# Patient Record
Sex: Male | Born: 1995 | Race: Black or African American | Hispanic: No | Marital: Single | Smoking: Never smoker
Health system: Southern US, Community
[De-identification: ages and names within clinical notes are randomized; demographics above are authoritative.]

## PROBLEM LIST (undated history)

## (undated) DIAGNOSIS — K219 Gastro-esophageal reflux disease without esophagitis: Secondary | ICD-10-CM

## (undated) DIAGNOSIS — T7840XA Allergy, unspecified, initial encounter: Secondary | ICD-10-CM

## (undated) DIAGNOSIS — R55 Syncope and collapse: Secondary | ICD-10-CM

## (undated) HISTORY — DX: Allergy, unspecified, initial encounter: T78.40XA

## (undated) HISTORY — DX: Syncope and collapse: R55

## (undated) HISTORY — DX: Gastro-esophageal reflux disease without esophagitis: K21.9

---
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
## 2017-02-19 ENCOUNTER — Emergency Department (HOSPITAL_COMMUNITY)
Admission: EM | Admit: 2017-02-19 | Discharge: 2017-02-19 | Disposition: A | Discharge status: Home / Self Care | Payer: 59 | Attending: Emergency Medicine | Admitting: Emergency Medicine

## 2017-02-19 ENCOUNTER — Encounter (HOSPITAL_COMMUNITY): Payer: Self-pay

## 2017-02-19 DIAGNOSIS — B9789 Other viral agents as the cause of diseases classified elsewhere: Secondary | ICD-10-CM

## 2017-02-19 DIAGNOSIS — T7849XA Other allergy, initial encounter: Secondary | ICD-10-CM | POA: Diagnosis not present

## 2017-02-19 DIAGNOSIS — Z79899 Other long term (current) drug therapy: Secondary | ICD-10-CM | POA: Insufficient documentation

## 2017-02-19 DIAGNOSIS — J329 Chronic sinusitis, unspecified: Secondary | ICD-10-CM | POA: Insufficient documentation

## 2017-02-19 DIAGNOSIS — R0981 Nasal congestion: Secondary | ICD-10-CM | POA: Diagnosis present

## 2017-02-19 DIAGNOSIS — Z9109 Other allergy status, other than to drugs and biological substances: Secondary | ICD-10-CM

## 2017-02-19 LAB — RAPID STREP SCREEN (MED CTR MEBANE ONLY): Streptococcus, Group A Screen (Direct): NEGATIVE

## 2017-02-19 MED ORDER — OMEPRAZOLE 20 MG PO CPDR: 20.0000 mg | DELAYED_RELEASE_CAPSULE | Freq: Every day | ORAL | 0 refills | Status: DC

## 2017-02-19 MED ORDER — FEXOFENADINE HCL 60 MG PO TABS: 60.0000 mg | ORAL_TABLET | Freq: Every day | ORAL | 0 refills | Status: DC

## 2017-02-19 MED ORDER — PREDNISONE 50 MG PO TABS
50.0000 mg | ORAL_TABLET | Freq: Once | ORAL | Status: AC
  Administered 2017-02-19: 50 mg via ORAL
  Filled 2017-02-19: qty 1

## 2017-02-19 MED ORDER — FLUTICASONE PROPIONATE 50 MCG/ACT NA SUSP: 2.0000 | Freq: Every day | NASAL | 12 refills | Status: DC

## 2017-02-19 NOTE — ED Notes (Signed)
Pt c/o 2/10 sinus pressure, intermittent episodes of slurred speech, and a couple episodes of aphasia.

## 2017-02-19 NOTE — ED Provider Notes (Signed)
[facility]-EMERGENCY DEPT Provider Note   CSN: [id number] Arrival date & time: 02/19/17  5956     History   Chief Complaint Chief Complaint  Patient presents with  . Facial Pain    HPI Randall Delgado is a 21 y.o. male who presents to the emergency department today for 3 days of sinus congestion. The patient states that 3 days ago he started experiencing gradually increasing sinus pressure with associated rhinorrhea, muscle aches, rhinorrhea, sneezing, fatigue and sore throat, myalgias. Reports taking ibuprofen and dayquil for symptoms with moderate relief. No fever, chills, unexpected weight loss, dental pain, difficulty swallowing, ear pain, dyspnea on exertion, abdominal pain, HA, vomiting or diarrhea.   The patient notes that yesterday while talking to his wife he had an epiosde of slurred speech that resolved immediatly. He states when picked up this morning by EMS, he was asked his birthday but: "I was able to think of an answer, but I was unable to speak". The patient symptoms resolved. He had no associated neurological symptoms. Denies HA. The patient reports an episode of chest tightness lasting 5 minutes around 6am this morning. Denies illicit drug use. Patient denies risk factors for DVT/PE including recent surgery, trauma, immobility, previous blood clot, smoking, HRT, or history of cancer or thrombophilia.    HPI  History reviewed. No pertinent past medical history.  Patient Active Problem List   Diagnosis Date Noted  . 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 No pertinent surgical history.     Home Medications    Prior to Admission medications   Medication Sig Start Date End Date Taking? Authorizing Provider  fexofenadine (ALLEGRA) 60 MG tablet Take 1 tablet (60 mg total) by mouth daily. 02/19/17   Keanen Dohse, Elmer Sow, PA-C  fluticasone (FLONASE) 50 MCG/ACT nasal spray Place 2 sprays into both nostrils daily. 02/19/17   Anshul Meddings, Elmer Sow, PA-C  omeprazole  (PRILOSEC) 20 MG capsule Take 1 capsule (20 mg total) by mouth daily. 02/19/17   Jayton Popelka, Elmer Sow, PA-C    Family History History reviewed. No pertinent family history.  Social History Social History  Substance Use Topics  . Smoking status: Never Smoker  . Smokeless tobacco: Never Used  . Alcohol use No     Allergies   Patient has no known allergies.   Review of Systems Review of Systems  All other systems reviewed and are negative.    Physical Exam Updated Vital Signs BP 136/78 (BP Location: Right Arm)   Pulse 67   Temp 97.7 F (36.5 C) (Oral)   Resp 18   Ht 6' (1.829 m)   Wt 54 kg (119 lb)   SpO2 100%   BMI 16.14 kg/m   Physical Exam  Constitutional: He appears well-developed and well-nourished. No distress.  HENT:  Head: Normocephalic and atraumatic.  Right Ear: Hearing, tympanic membrane, external ear and ear canal normal. No foreign bodies. Tympanic membrane is not injected, not perforated, not erythematous, not retracted and not bulging.  Left Ear: Hearing, tympanic membrane, external ear and ear canal normal. No foreign bodies. Tympanic membrane is not injected, not perforated, not erythematous, not retracted and not bulging.  Nose: Right sinus exhibits maxillary sinus tenderness and frontal sinus tenderness. Left sinus exhibits maxillary sinus tenderness and frontal sinus tenderness.  Mouth/Throat: Uvula is midline and mucous membranes are normal. Posterior oropharyngeal erythema present. No oropharyngeal exudate or posterior oropharyngeal edema. No tonsillar exudate.  Eyes: Conjunctivae, EOM and lids are normal. Pupils are equal, round, and  reactive to light. Right eye exhibits no discharge. Left eye exhibits no discharge. Right conjunctiva is not injected. Left conjunctiva is not injected.  Neck: Normal range of motion. Neck supple.  Cardiovascular: Normal rate, regular rhythm and intact distal pulses.   No murmur heard. Pulmonary/Chest: Effort normal and  breath sounds normal. He has no wheezes. He exhibits no tenderness.  Abdominal: Soft. Bowel sounds are normal. There is no tenderness. There is no rebound and no guarding.  Musculoskeletal: He exhibits no edema.  Lymphadenopathy:    He has no cervical adenopathy.  Neurological: He is alert.  Speech clear. Follows commands. PERRLA. Chart for movements intact. Normal peripheral fields. Cranial nerves III through XII intact. Moves all extremities 4. Normal strength to upper and lower extremities bilaterally. Grip strength equal bilaterally. Sensation to light touch intact bilaterally. Patellar deep tendon reflex 2+ and equal bilaterally. Coordination intact. No limb ataxia. Finger to nose normal. Rapid alternating movements normal. No pronator drift. Normal gait.   Skin: Skin is warm and dry. No rash noted. He is not diaphoretic.  Psychiatric: He has a normal mood and affect.  Nursing note and vitals reviewed.    ED Treatments / Results  Labs (all labs ordered are listed, but only abnormal results are displayed) Labs Reviewed  RAPID STREP SCREEN (NOT AT [facility])  CULTURE, GROUP A STREP [facility])    EKG  EKG Interpretation  Date/Time:  Monday February 19 2017 08:05:52 EDT Ventricular Rate:  59 PR Interval:    QRS Duration: 104 QT Interval:  430 QTC Calculation: 426 R Axis:   43 Text Interpretation:  Sinus rhythm ST elev, probable normal early repol pattern No previous tracing Confirmed by Denton Lank  MD, Caryn Bee ([id number]) on 02/19/2017 8:24:05 AM       Radiology No results found.  Procedures Procedures (including critical care time)  Medications Ordered in ED Medications  predniSONE (DELTASONE) tablet 50 mg (50 mg Oral Given 02/19/17 0809)     Initial Impression / Assessment and Plan / ED Course  I have reviewed the triage vital signs and the nursing notes.  Pertinent labs & imaging results that were available during my care of the patient were reviewed by me and considered in my medical  decision making (see chart for details).     This is a 21 year old male who presents today with URI like symptoms. Patient non-toxic appearing with normal vital signs and benign HEENT other then sinus tenderness, erythema of posterior pharynx without exudate. Normal cardiopulmonary exam. Patient also complaining of speech like disturbances. Patient neuro exam completely normal. Will observe to see if patient symptoms return. If patient stable will discharge home with strict return precautions and close follow up with PCP.   Strep test negative. Sent for culture.   Discussed with patient option to do further testing for neurologic and chest tightness symptoms. Patient does not wish to having imaging, blood work or further testing at this time. Understands risks. He agrees to close follow up with primary care provider and return precautions.   EKG sinus rhythm with shows early repol pattern. Low suspicion for ACS with history.   PERC r/o PE.   At time of discharge the patient is resting comfortably, non-toxic appearing, normal vital signs. Will give patient fluticasone nasal spray, allergy medication for symptoms. Discussed with patient the need to continue using fluticasone nasal spray for at least 2 weeks to notice full effects. Patient agrees. Gave information for Primary Care Follow up. Return precautions given. Patient told  they can return at anytime for worsening or new concerning symptoms. All questions answered.   Final Clinical Impressions(s) / ED Diagnoses   Final diagnoses:  Viral sinusitis  Environmental allergies    New Prescriptions New Prescriptions   FEXOFENADINE (ALLEGRA) 60 MG TABLET    Take 1 tablet (60 mg total) by mouth daily.   FLUTICASONE (FLONASE) 50 MCG/ACT NASAL SPRAY    Place 2 sprays into both nostrils daily.   OMEPRAZOLE (PRILOSEC) 20 MG CAPSULE    Take 1 capsule (20 mg total) by mouth daily.     Jacinto HalimMaczis, Obert Espindola M, PA-C 02/19/17 [id number]    Jacinto HalimMaczis, Kin Galbraith M,  PA-C 02/19/17 0935    Jacinto HalimMaczis, Taha Dimond M, PA-C 02/19/17 [id number]    Cathren LaineSteinl, Kevin, MD 02/20/17 [phone]

## 2017-02-19 NOTE — ED Triage Notes (Addendum)
Sinus pressure and pain for about 3 days states he is feeling out of it alert and oriented x 3 steady gait noted no respiratory or acute distress noted. Clear speech noted moves all extremities.

## 2017-02-19 NOTE — Discharge Instructions (Signed)
Your symptoms are concurrent with a viral illness. I am giving you a nasal spray that I want you to take everyday. You will need to use this >1 week in order to see the effects. Please also take the allergy medication I am prescribing. You can take 600mg  Ibuprofen ever 6 hours for pain as needed. If you are taking ibuprofen daily please also take the omeprazole that I am prescribing you to protect your stomach. Please follow up with primary care in the next week. If start developing worsening symptoms or new concerning symptoms you can return for re-evaluation.

## 2017-02-21 LAB — CULTURE, GROUP A STREP (THRC)

## 2017-02-24 ENCOUNTER — Encounter (HOSPITAL_COMMUNITY): Payer: Self-pay

## 2017-02-24 ENCOUNTER — Emergency Department (HOSPITAL_COMMUNITY)
Admission: EM | Admit: 2017-02-24 | Discharge: 2017-02-24 | Disposition: A | Discharge status: Home / Self Care | Payer: 59 | Attending: Emergency Medicine | Admitting: Emergency Medicine

## 2017-02-24 DIAGNOSIS — L259 Unspecified contact dermatitis, unspecified cause: Secondary | ICD-10-CM | POA: Diagnosis not present

## 2017-02-24 DIAGNOSIS — J029 Acute pharyngitis, unspecified: Secondary | ICD-10-CM | POA: Diagnosis present

## 2017-02-24 MED ORDER — TRIAMCINOLONE ACETONIDE 0.1 % EX CREA
1.0000 | TOPICAL_CREAM | Freq: Two times a day (BID) | CUTANEOUS | 0 refills | Status: DC
Start: 2017-02-24 — End: 2024-02-14

## 2017-02-24 MED ORDER — PREDNISONE 20 MG PO TABS
60.0000 mg | ORAL_TABLET | Freq: Once | ORAL | Status: AC
  Administered 2017-02-24: 60 mg via ORAL
  Filled 2017-02-24: qty 3

## 2017-02-24 MED ORDER — DIPHENHYDRAMINE HCL 25 MG PO CAPS
50.0000 mg | ORAL_CAPSULE | Freq: Once | ORAL | Status: AC
  Administered 2017-02-24: 50 mg via ORAL
  Filled 2017-02-24: qty 2

## 2017-02-24 MED ORDER — PREDNISONE 10 MG PO TABS: 40.0000 mg | ORAL_TABLET | Freq: Every day | ORAL | 0 refills | Status: AC

## 2017-02-24 NOTE — ED Provider Notes (Signed)
[facility]-EMERGENCY DEPT Provider Note   CSN: [id number] Arrival date & time: 02/24/17  1246     History   Chief Complaint Chief Complaint  Patient presents with  . Sore Throat    HPI Randall Delgado is a 21 y.o. male with no past medical history presenting with worsening rash on the back of his neck for the last day which he reports is burning. He also felt like his throat was closing in when he tries to swallow and it feels scratchy. He reports being seen on Monday at [facility] for complaints of chest tightness slurred speech jerking movements dizziness headache and was prescribed Prilosec, Flonase and Allegra. He states that he has been taking Allegra and Prilosec which she thought was making him feel strange. He did not try Flonase. Overall his symptoms have improved since then and his appetite has returned except for the swallowing and rash on his neck.   HPI  History reviewed. No pertinent past medical history.  Patient Active Problem List   Diagnosis Date Noted  . 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 No pertinent surgical history.     Home Medications    Prior to Admission medications   Medication Sig Start Date End Date Taking? Authorizing Provider  fexofenadine (ALLEGRA) 60 MG tablet Take 1 tablet (60 mg total) by mouth daily. 02/19/17   Maczis, Elmer SowMichael M, PA-C  fluticasone (FLONASE) 50 MCG/ACT nasal spray Place 2 sprays into both nostrils daily. 02/19/17   Maczis, Elmer SowMichael M, PA-C  omeprazole (PRILOSEC) 20 MG capsule Take 1 capsule (20 mg total) by mouth daily. 02/19/17   Maczis, Elmer SowMichael M, PA-C  predniSONE (DELTASONE) 10 MG tablet Take 4 tablets (40 mg total) by mouth daily. 02/24/17 02/28/17  Mathews RobinsonsMitchell, Zaylee Cornia B, PA-C  triamcinolone cream (KENALOG) 0.1 % Apply 1 application topically 2 (two) times daily. 02/24/17   Georgiana ShoreMitchell, Blayre Papania B, PA-C    Family History No family history on file.  Social History Social History  Substance Use Topics  .  Smoking status: Never Smoker  . Smokeless tobacco: Never Used  . Alcohol use No     Allergies   Patient has no known allergies.   Review of Systems Review of Systems  Constitutional: Negative for activity change, appetite change, chills and fever.  HENT: Positive for sore throat and trouble swallowing. Negative for facial swelling, sinus pain, sinus pressure and voice change.   Eyes: Negative for pain and visual disturbance.  Respiratory: Negative for cough, shortness of breath, wheezing and stridor.   Cardiovascular: Negative for chest pain, palpitations and leg swelling.  Gastrointestinal: Negative for abdominal pain, nausea and vomiting.  Genitourinary: Negative for dysuria and hematuria.  Musculoskeletal: Negative for arthralgias, back pain, myalgias, neck pain and neck stiffness.  Skin: Negative for color change, pallor and rash.  Neurological: Negative for seizures, syncope and weakness.     Physical Exam Updated Vital Signs BP 114/64 (BP Location: Right Arm)   Pulse (!) 58   Temp 98.4 F (36.9 C) (Oral)   Resp 16   SpO2 100%   Physical Exam  Constitutional: He appears well-developed and well-nourished. No distress.  Patient is afebrile well-appearing sitting comfortably in chair in no acute distress.  HENT:  Head: Normocephalic and atraumatic.  Mouth/Throat: Oropharynx is clear and moist. No oropharyngeal exudate.  No gross oral abscess. Uvula is midline, arches are simmetrical and intact. No peritonsilar swelling or exudate. No trismus. Sublingual mucosa is soft and non-tender. Tolerating oral secretions. No  concern for ludwig's angina.   Eyes: Conjunctivae and EOM are normal. Right eye exhibits no discharge. Left eye exhibits no discharge.  Neck: Normal range of motion. Neck supple.  Cardiovascular: Normal rate, regular rhythm and normal heart sounds.   No murmur heard. Pulmonary/Chest: Effort normal and breath sounds normal. No respiratory distress.    Abdominal: He exhibits no distension.  Musculoskeletal: He exhibits no edema.  Lymphadenopathy:    He has no cervical adenopathy.  Neurological: He is alert.  Skin: Skin is warm and dry. Rash noted. He is not diaphoretic. No erythema. No pallor.  Patient has a papular rash on the back of his neck  Psychiatric: He has a normal mood and affect.  Nursing note and vitals reviewed.    ED Treatments / Results  Labs (all labs ordered are listed, but only abnormal results are displayed) Labs Reviewed - No data to display  EKG  EKG Interpretation None       Radiology No results found.  Procedures Procedures (including critical care time)  Medications Ordered in ED Medications  diphenhydrAMINE (BENADRYL) capsule 50 mg (not administered)  predniSONE (DELTASONE) tablet 60 mg (not administered)     Initial Impression / Assessment and Plan / ED Course  I have reviewed the triage vital signs and the nursing notes.  Pertinent labs & imaging results that were available during my care of the patient were reviewed by me and considered in my medical decision making (see chart for details).    Patient presenting with a rash on the back of his neck and a sensation that his throat is tight when he swallows and irritated.  Reassuring exam, no exudate, evidence of abscess, erythema, no cervical adenopathy. Patient has been having upper respiratory symptoms, postnasal drip for the past few days. It appears to be likely to result from irritation of his throat from his viral URI.   Will treat with prednisone and Benadryl and observed in ED and reassess. On reassessment, patient reported improvement in swallowing and had been drinking water.  Patient's rash appears to be contact dermatitis. Patient is unsure of what he came in contact with.   Will discharge home with triamcinolone cream and a short course of prednisone with close follow-up with PCP for possible allergy skin testing. Patient  was well appearing and stable in ED.  Discussed strict return precautions and advised to return to the emergency department if experiencing any new or worsening symptoms. Instructions were understood and patient agreed with discharge plan.  Final Clinical Impressions(s) / ED Diagnoses   Final diagnoses:  Contact dermatitis, unspecified contact dermatitis type, unspecified trigger    New Prescriptions New Prescriptions   PREDNISONE (DELTASONE) 10 MG TABLET    Take 4 tablets (40 mg total) by mouth daily.   TRIAMCINOLONE CREAM (KENALOG) 0.1 %    Apply 1 application topically 2 (two) times daily.     Georgiana Shore, PA-C 02/24/17 1740    Bethann Berkshire, MD 02/24/17 [phone]

## 2017-02-24 NOTE — Discharge Instructions (Signed)
As discussed, stay well-hydrated and keep your urine clear. Used tea with honey or cough drops to soothe her throat. I would recommend using the nasal spray to help clear his sinuses in addition to your antihistamine.  Take prednisone for the next 4 days to help with inflammation and reduce swelling. Triamcinolone cream for your rash. Follow-up with a primary care provider for possible allergy skin testing and to establish care.   Return to the emergency department if you experience worsening symptoms, including difficulty breathing, increased swelling, or any other new concerning symptoms in the meantime.

## 2017-02-24 NOTE — ED Triage Notes (Signed)
Pt reports sore throat and pain with swallowing onset this morning around 930 this morning. Similar complaints on Monday, [facility] and [facility] for strep which came back negative. His concern today is the sore throat and states he has a rash to his neck.

## 2017-02-24 NOTE — ED Notes (Signed)
Declined W/C at D/C and was escorted to lobby by RN. 

## 2023-07-26 DIAGNOSIS — J309 Allergic rhinitis, unspecified: Secondary | ICD-10-CM | POA: Insufficient documentation

## 2024-02-07 ENCOUNTER — Emergency Department (HOSPITAL_BASED_OUTPATIENT_CLINIC_OR_DEPARTMENT_OTHER)
Admission: EM | Admit: 2024-02-07 | Discharge: 2024-02-08 | Disposition: A | Discharge status: Home / Self Care | Attending: Emergency Medicine | Admitting: Emergency Medicine

## 2024-02-07 ENCOUNTER — Encounter (HOSPITAL_BASED_OUTPATIENT_CLINIC_OR_DEPARTMENT_OTHER): Payer: Self-pay

## 2024-02-07 ENCOUNTER — Other Ambulatory Visit: Payer: Self-pay

## 2024-02-07 DIAGNOSIS — R55 Syncope and collapse: Secondary | ICD-10-CM | POA: Insufficient documentation

## 2024-02-07 DIAGNOSIS — E860 Dehydration: Secondary | ICD-10-CM | POA: Diagnosis not present

## 2024-02-07 DIAGNOSIS — E162 Hypoglycemia, unspecified: Secondary | ICD-10-CM | POA: Insufficient documentation

## 2024-02-07 LAB — COMPREHENSIVE METABOLIC PANEL WITH GFR
ALT: 40 U/L (ref 0–44)
AST: 39 U/L (ref 15–41)
Albumin: 4.5 g/dL (ref 3.5–5.0)
Alkaline Phosphatase: 98 U/L (ref 38–126)
Anion gap: 13 (ref 5–15)
BUN: 17 mg/dL (ref 6–20)
CO2: 23 mmol/L (ref 22–32)
Calcium: 10 mg/dL (ref 8.9–10.3)
Chloride: 104 mmol/L (ref 98–111)
Creatinine, Ser: 1.2 mg/dL (ref 0.61–1.24)
GFR, Estimated: >60 mL/min (ref >60)
Glucose, Bld: 66 mg/dL — ABNORMAL LOW (ref 70–99)
Potassium: 3.9 mmol/L (ref 3.5–5.1)
Sodium: 140 mmol/L (ref 135–145)
Total Bilirubin: 0.3 mg/dL (ref 0.0–1.2)
Total Protein: 7.2 g/dL (ref 6.5–8.1)

## 2024-02-07 LAB — CBC
HCT: 44 % (ref 39.0–52.0)
Hemoglobin: 14.6 g/dL (ref 13.0–17.0)
MCH: 30.6 pg (ref 26.0–34.0)
MCHC: 33.2 g/dL (ref 30.0–36.0)
MCV: 92.2 fL (ref 80.0–100.0)
Platelets: 231 10*3/uL (ref 150–400)
RBC: 4.77 MIL/uL (ref 4.22–5.81)
RDW: 12.3 % (ref 11.5–15.5)
WBC: 9.3 10*3/uL (ref 4.0–10.5)
nRBC: 0 % (ref 0.0–0.2)

## 2024-02-07 LAB — CBG MONITORING, ED: Glucose-Capillary: 88 mg/dL (ref 70–99)

## 2024-02-07 LAB — CK: Total CK: 812 U/L — ABNORMAL HIGH (ref 49–397)

## 2024-02-07 LAB — TROPONIN T, HIGH SENSITIVITY: Troponin T High Sensitivity: <15 ng/L (ref <19)

## 2024-02-07 MED ORDER — LACTATED RINGERS IV BOLUS
1000.0000 mL | Freq: Once | INTRAVENOUS | Status: AC
  Administered 2024-02-07: 1000 mL via INTRAVENOUS

## 2024-02-07 NOTE — ED Triage Notes (Signed)
 Pt c/o witnessed syncopal episode "a few hours ago." States he was "getting a piano down some stairs, passed out afterwards," states he "went down pretty easy." Advises he feels "better, but want to be checked out."

## 2024-02-07 NOTE — ED Notes (Signed)
CBG 82 

## 2024-02-08 LAB — URINALYSIS, ROUTINE W REFLEX MICROSCOPIC
Bacteria, UA: NONE SEEN
Bilirubin Urine: NEGATIVE
Glucose, UA: NEGATIVE mg/dL
Hgb urine dipstick: NEGATIVE
Ketones, ur: NEGATIVE mg/dL
Leukocytes,Ua: NEGATIVE
Nitrite: NEGATIVE
Protein, ur: NEGATIVE mg/dL
Specific Gravity, Urine: 1.005 (ref 1.005–1.030)
pH: 7 (ref 5.0–8.0)

## 2024-02-08 NOTE — Discharge Instructions (Addendum)
 You were seen in the emergency department today for evaluation of your symptoms.  I am glad that you are feeling better.  You would need to make sure that you are eating and drinking frequently throughout the day as I think that your becoming more dehydrated and your blood sugar is dropping.  I would like for you to follow-up with your primary care provider as you may need continuous monitoring of your glucose to make sure it is staying within normal levels.  I have included more information on hypoglycemia, near syncope, and dehydration into the discharge paperwork.  I am giving the next few days off of work for you to rest to rehydrate your body so that you can go back to work safely.  If you start to have a recurrence of your symptoms, please return to the emergency department.  If you have any other concerns, new or worsening symptoms, please return to your nearest emergency department for reevaluation.  Contact a doctor if: You continue to have episodes of near fainting. Get help right away if: You pass out or faint. You have any of these symptoms: Fast or uneven heartbeats (palpitations). Pain in your chest, belly, or back. Shortness of breath. You have a seizure. You have a very bad headache. You are confused. You have trouble seeing. You are very weak. You have trouble walking. You are bleeding from your mouth or butt. You have black or tarry poop (stool). These symptoms may be an emergency. Get help right away. Call your local emergency services (911 in the [place] Do not wait to see if the symptoms will go away. Do not drive yourself to the hospital.

## 2024-02-08 NOTE — ED Provider Notes (Signed)
 [name] EMERGENCY DEPARTMENT AT [facility] Provider Note   CSN: [id number] Arrival date & time: 02/07/24  1845     History No chief complaint on file.   Randall Delgado is a 28 y.o. male with history of seasonal allergies presents emerged from today for evaluation of questionable syncope or near syncope event.  Earlier today, patient was carrying a baby grand piano and had to hold the PNO for greater than 5 minutes while walking downstairs.  Reports it was a "long walk".  Afterwards he did feel "woozy" and got up to get a Gatorade.  Drink some Gatorade and was walking back and felt the wooziness again.  He reports he took knee and fell over to the side.  He reports that the person that was moving him was a physician and told him to lift up his legs and gave him fluids.  He reports that he did feel some shortness of breath after the possible syncopal/near syncopal episode.  He does reports that he does remember falling to the side.  He denies any headache, blurry vision, chest pain, or palpitations before or after the event.  He reports that he did have some shortness of breath and describes it as what she feels before you vomit after he had the near-syncopal event does not think that he hit his head and denies any head pain now.  He denies any blurry vision or visual changes now.  He was called off of work and did drive himself home.  His wife did bring him into the emergency room.  He denies any previous cardiac history or known cardiac death before the age of 65 and his family.  He denies any recent long travel, history of DVT/PE, leg swelling, hemoptysis.  He denies any nausea or vomiting.  He reports that he had eaten [employer] with his salad, chicken, and lemonade before the syncopal event and had eaten a good breakfast as well.  Does report that he does try to stay well-hydrated as well.  Has not had a previous symptoms like this before.  No recent illnesses.  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  Does take montelukast and sertraline.  No known drug allergies.  Denies any tobacco, EtOH illicit drug use. HPI     Home Medications Prior to Admission medications   Medication Sig Start Date End Date Taking? Authorizing Provider  fexofenadine  (ALLEGRA ) 60 MG tablet Take 1 tablet (60 mg total) by mouth daily. 02/19/17   Maczis, Michael M, PA-C  fluticasone  (FLONASE ) 50 MCG/ACT nasal spray Place 2 sprays into both nostrils daily. 02/19/17   Maczis, Michael M, PA-C  omeprazole  (PRILOSEC) 20 MG capsule Take 1 capsule (20 mg total) by mouth daily. 02/19/17   Maczis, Michael M, PA-C  triamcinolone  cream (KENALOG ) 0.1 % Apply 1 application topically 2 (two) times daily. 02/24/17   Mitchell, Jessica B, PA-C      Allergies    Patient has no known allergies.    Review of Systems   Review of Systems  Constitutional:  Negative for chills and fever.  Eyes:  Negative for visual disturbance.  Cardiovascular:  Negative for chest pain.  Gastrointestinal:  Negative for abdominal pain, diarrhea, nausea and vomiting.  Musculoskeletal:  Negative for myalgias.  Neurological:  Positive for syncope and light-headedness. Negative for weakness and headaches.    Physical Exam Updated Vital Signs BP 109/63  Pulse 68   Temp 98.4 F (36.9 C)   Resp 18   SpO2 98%  Physical Exam Vitals and nursing note reviewed.  Constitutional:      General: He is not in acute distress.    Appearance: He is not ill-appearing or toxic-appearing.  HENT:     Mouth/Throat:     Mouth: Mucous membranes are moist.  Eyes:     General: No scleral icterus.    Extraocular Movements: Extraocular movements intact.     Pupils: Pupils are equal, round, and reactive to light.  Cardiovascular:     Rate and Rhythm: Normal rate.     Pulses:          Radial pulses are 2+ on the right side and 2+ on the left side.       Dorsalis pedis pulses are 2+ on the  right side and 2+ on the left side.       Posterior tibial pulses are 2+ on the right side and 2+ on the left side.  Pulmonary:     Effort: Pulmonary effort is normal. No respiratory distress.  Musculoskeletal:     Cervical back: No tenderness.     Right lower leg: No edema.     Left lower leg: No edema.  Skin:    General: Skin is warm and dry.  Neurological:     General: No focal deficit present.     Mental Status: He is alert.     Cranial Nerves: No cranial nerve deficit.     Sensory: No sensory deficit.     Motor: No weakness.     Gait: Gait normal.     ED Results / Procedures / Treatments   Labs (all labs ordered are listed, but only abnormal results are displayed) Labs Reviewed  COMPREHENSIVE METABOLIC PANEL WITH GFR - Abnormal; Notable for the following components:      Result Value   Glucose, Bld 66 (*)    All other components within normal limits  CK - Abnormal; Notable for the following components:   Total CK 812 (*)    All other components within normal limits  CBC  URINALYSIS, ROUTINE W REFLEX MICROSCOPIC  CBG MONITORING, ED  CBG MONITORING, ED  TROPONIN T, HIGH SENSITIVITY    EKG EKG Interpretation Date/Time:  Thursday Feb 07 2024 19:14:58 EDT Ventricular Rate:  64 PR Interval:  162 QRS Duration:  96 QT Interval:  378 QTC Calculation: 389 R Axis:   40  Text Interpretation: Normal sinus rhythm with sinus arrhythmia Normal ECG When compared with ECG of 19-Feb-2017 08:05, PREVIOUS ECG IS PRESENT when compared to prior, similar appearance no STEMI Confirmed by Wynell Heath ([id number]) on 02/07/2024 9:59:34 PM  Radiology No results found.  Procedures Procedures   Medications Ordered in ED Medications  lactated ringers  bolus 1,000 mL (0 mLs Intravenous Stopped 02/07/24 2253)  lactated ringers  bolus 1,000 mL (0 mLs Intravenous Stopped 02/07/24 2345)    ED Course/ Medical Decision Making/ A&P Clinical Course as of 02/08/24 0103  Fri Feb 08, 2024  0039  Patient reports he is feeling much better and "like himself". He did bring the urine cup in the bathroom with him, but forgot to urinate in it. Will wait to obtain another urine sample.  [RR]    Clinical Course User Index [RR] Spence Dux, PA-C  Medical Decision Making Amount and/or Complexity of Data Reviewed Labs: ordered.   28 y.o. male presents to the ER for evaluation of development. Differential diagnosis includes but is not limited to CVA, ACS, arrhythmia, vasovagal / orthostatic hypotension, sepsis, hypoglycemia, electrolyte disturbance, respiratory failure, anemia, dehydration, heat injury, polypharmacy, malignancy, anxiety/panic attack. Vital signs unremarkable. Physical exam as noted above.   Patient has an overall unremarkable examination.  No focal deficits.  Pulses equal and symmetric bilaterally in upper and lower extremities.  Regular rate and rhythm.  Patient may be dehydrated or hypoglycemic.  Can consider rhabdomyolysis as well.  He reports he just feels fatigued now but has not had any recurrence of the lightheadedness.  Will order lab work, EKG, and IV fluids.  I independently reviewed and interpreted the patient's labs.  CMP shows glucose at 66 otherwise no electrolyte or LFT abnormality.  CBC without leukocytosis or anemia.  Urinalysis colorless urine otherwise unremarkable.  Troponin less than 15.  CBG 82.  CK mildly elevated 812.  EKG reviewed and interpreted by my attending and read as Normal sinus rhythm with sinus arrhythmia Normal ECG When compared with ECG of 19-Feb-2017 08:05, PREVIOUS ECG IS PRESENT when compared to prior, similar appearance no STEMI.   With the elevated CK I did provide the patient another bag of IV fluids.  He was given snacks and drinks here for elevation of his blood sugar which is now in the 80s.  On reevaluation, patient reports he feels much better and is "like himself".  I discussed this case with my  attending, likely more vasovagal syncope given that he was carrying something heavy and then started to feel lightheaded afterwards.  He denies any head or neck pain.  He has symmetric pulses bilaterally in upper and lower extremities.  Doubt any dissection or aneurysm at this time.  More likely dehydration, hypoglycemia as well in the setting of working multiple days doing strenuous activity.  I had a long discussion with patient and wife at bedside about staying well-hydrated, eating multiple small meals as he may need to be put on glucose monitoring of his blood sugars dropping too low.  It is odd for his blood sugar to be in the 60s when he recently had a large meal.  We discussed taking a few days off to rest and recuperate given his busy work schedule as well.  Troponins are within normal limits, EKG does not show any ischemia.  He is not having any headache, chest pain, neck pain.  I do not think any other imaging is needed at this time.  Doubt any ACS.  Denies any previous cardiac history or known family history.  Given he feels back to his baseline, no significant lab normality other than slightly elevated CK he is stable for discharge home with close outpatient follow-up and strict return precautions.  We discussed the results of the labs/imaging. The plan is hydration, multiple small meals, follow up with PCP. We discussed strict return precautions and red flag symptoms. The patient verbalized their understanding and agrees to the plan. The patient is stable and being discharged home in good condition.  I discussed this case with my attending physician who cosigned this note including patient's presenting symptoms, physical exam, and planned diagnostics and interventions. Attending physician stated agreement with plan or made changes to plan which were implemented.   Portions of this report may have been transcribed using voice recognition software. Every effort was made to ensure accuracy; however,  inadvertent  computerized transcription errors may be present.    Final Clinical Impression(s) / ED Diagnoses Final diagnoses:  Dehydration  Vasovagal near syncope  Hypoglycemia    Rx / DC Orders ED Discharge Orders     None         Spence Dux, PA-C 02/11/24 1357    Tegeler, Marine Sia, MD 02/11/24 [phone]

## 2024-02-09 LAB — CBG MONITORING, ED: Glucose-Capillary: 82 mg/dL (ref 70–99)

## 2024-02-11 ENCOUNTER — Encounter (HOSPITAL_BASED_OUTPATIENT_CLINIC_OR_DEPARTMENT_OTHER): Payer: Self-pay

## 2024-02-11 ENCOUNTER — Other Ambulatory Visit: Payer: Self-pay

## 2024-02-11 ENCOUNTER — Emergency Department (HOSPITAL_BASED_OUTPATIENT_CLINIC_OR_DEPARTMENT_OTHER)
Admission: EM | Admit: 2024-02-11 | Discharge: 2024-02-11 | Disposition: A | Discharge status: Home / Self Care | Attending: Emergency Medicine | Admitting: Emergency Medicine

## 2024-02-11 ENCOUNTER — Emergency Department (HOSPITAL_BASED_OUTPATIENT_CLINIC_OR_DEPARTMENT_OTHER): Admitting: Radiology

## 2024-02-11 DIAGNOSIS — R55 Syncope and collapse: Secondary | ICD-10-CM | POA: Diagnosis present

## 2024-02-11 LAB — COMPREHENSIVE METABOLIC PANEL WITH GFR
ALT: 50 U/L — ABNORMAL HIGH (ref 0–44)
AST: 35 U/L (ref 15–41)
Albumin: 4.7 g/dL (ref 3.5–5.0)
Alkaline Phosphatase: 105 U/L (ref 38–126)
Anion gap: 11 (ref 5–15)
BUN: 13 mg/dL (ref 6–20)
CO2: 25 mmol/L (ref 22–32)
Calcium: 10 mg/dL (ref 8.9–10.3)
Chloride: 102 mmol/L (ref 98–111)
Creatinine, Ser: 1.16 mg/dL (ref 0.61–1.24)
GFR, Estimated: >60 mL/min (ref >60)
Glucose, Bld: 90 mg/dL (ref 70–99)
Potassium: 3.9 mmol/L (ref 3.5–5.1)
Sodium: 138 mmol/L (ref 135–145)
Total Bilirubin: <0.2 mg/dL (ref 0.0–1.2)
Total Protein: 7.4 g/dL (ref 6.5–8.1)

## 2024-02-11 LAB — CBC
HCT: 45.7 % (ref 39.0–52.0)
Hemoglobin: 15 g/dL (ref 13.0–17.0)
MCH: 30.4 pg (ref 26.0–34.0)
MCHC: 32.8 g/dL (ref 30.0–36.0)
MCV: 92.7 fL (ref 80.0–100.0)
Platelets: 227 10*3/uL (ref 150–400)
RBC: 4.93 MIL/uL (ref 4.22–5.81)
RDW: 12.4 % (ref 11.5–15.5)
WBC: 7.5 10*3/uL (ref 4.0–10.5)
nRBC: 0 % (ref 0.0–0.2)

## 2024-02-11 LAB — URINALYSIS, ROUTINE W REFLEX MICROSCOPIC
Bacteria, UA: NONE SEEN
Bilirubin Urine: NEGATIVE
Glucose, UA: NEGATIVE mg/dL
Hgb urine dipstick: NEGATIVE
Ketones, ur: NEGATIVE mg/dL
Leukocytes,Ua: NEGATIVE
Nitrite: NEGATIVE
Protein, ur: NEGATIVE mg/dL
Specific Gravity, Urine: <1.005 — ABNORMAL LOW (ref 1.005–1.030)
pH: 6.5 (ref 5.0–8.0)

## 2024-02-11 LAB — CBG MONITORING, ED: Glucose-Capillary: 90 mg/dL (ref 70–99)

## 2024-02-11 NOTE — ED Notes (Signed)
 Patient arrives via EMS from work for near syncope. Was here Thursday for the same. 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 No fall or head strike. A&Ox4 and ambulatory.

## 2024-02-11 NOTE — ED Notes (Signed)
 Pt alert and oriented X 4 at the time of discharge. RR even and unlabored. No acute distress noted. Pt verbalized understanding of discharge instructions as discussed. Pt ambulatory to lobby at time of discharge.

## 2024-02-11 NOTE — ED Provider Notes (Signed)
 [place] EMERGENCY DEPARTMENT AT [facility] Provider Note   CSN: [id number] Arrival date & time: 02/11/24  1130     History  Chief Complaint  Patient presents with   Near Syncope    Randall Delgado is a 28 y.o. male.  28 year old male presents after near syncopal event.  Patient states that he went to lift a heavy object and became short of breath in fact was in a pass out.  Denies any associated chest pain with this.  Seen here a few days ago for similar symptoms and negative workup at this time.  He denies any history of structural cardiac disease.  No recent PE risk factors.  Does not take any medications currently.  Denies any syncope at the prior visit or today's event.  His symptoms did get better once he ceased activity       Home Medications Prior to Admission medications   Medication Sig Start Date End Date Taking? Authorizing Provider  fexofenadine  (ALLEGRA ) 60 MG tablet Take 1 tablet (60 mg total) by mouth daily. 02/19/17   Maczis, Michael M, PA-C  fluticasone  (FLONASE ) 50 MCG/ACT nasal spray Place 2 sprays into both nostrils daily. 02/19/17   Maczis, Michael M, PA-C  omeprazole  (PRILOSEC) 20 MG capsule Take 1 capsule (20 mg total) by mouth daily. 02/19/17   Maczis, Michael M, PA-C  triamcinolone  cream (KENALOG ) 0.1 % Apply 1 application topically 2 (two) times daily. 02/24/17   Mitchell, Jessica B, PA-C      Allergies    Patient has no known allergies.    Review of Systems   Review of Systems  All other systems reviewed and are negative.   Physical Exam Updated Vital Signs BP 116/77   Pulse 67   Temp 98.1 F (36.7 C)   Resp 16   Ht 1.753 m (5\' 9" )   Wt 83.9 kg   SpO2 100%   BMI 27.32 kg/m  Physical Exam Vitals and nursing note reviewed.  Constitutional:      General: He is not in acute distress.    Appearance: Normal appearance. He is well-developed. He is not toxic-appearing.  HENT:     Head: Normocephalic and atraumatic.  Eyes:      General: Lids are normal.     Conjunctiva/sclera: Conjunctivae normal.     Pupils: Pupils are equal, round, and reactive to light.  Neck:     Thyroid: No thyroid mass.     Trachea: No tracheal deviation.  Cardiovascular:     Rate and Rhythm: Normal rate and regular rhythm.     Heart sounds: Normal heart sounds. No murmur heard.    No gallop.  Pulmonary:     Effort: Pulmonary effort is normal. No respiratory distress.     Breath sounds: Normal breath sounds. No stridor. No decreased breath sounds, wheezing, rhonchi or rales.  Abdominal:     General: There is no distension.     Palpations: Abdomen is soft.     Tenderness: There is no abdominal tenderness. There is no rebound.  Musculoskeletal:        General: No tenderness. Normal range of motion.     Cervical back: Normal range of motion and neck supple.  Skin:    General: Skin is warm and dry.     Findings: No abrasion or rash.  Neurological:     Mental Status: He is alert and oriented to person, place, and time. Mental status is at baseline.     GCS:  GCS eye subscore is 4. GCS verbal subscore is 5. GCS motor subscore is 6.     Cranial Nerves: No cranial nerve deficit.     Sensory: No sensory deficit.     Motor: Motor function is intact.  Psychiatric:        Attention and Perception: Attention normal.        Speech: Speech normal.        Behavior: Behavior normal.     ED Results / Procedures / Treatments   Labs (all labs ordered are listed, but only abnormal results are displayed) Labs Reviewed  CBC  COMPREHENSIVE METABOLIC PANEL WITH GFR  URINALYSIS, ROUTINE W REFLEX MICROSCOPIC  CBG MONITORING, ED    EKG EKG Interpretation Date/Time:  Monday February 11 2024 11:43:56 EDT Ventricular Rate:  70 PR Interval:  162 QRS Duration:  98 QT Interval:  381 QTC Calculation: 412 R Axis:   38  Text Interpretation: Sinus rhythm RSR' in V1 or V2, right VCD or RVH ST elev, probable normal early repol pattern No significant change  since last tracing Confirmed by Lind Repine ([id number]) on 02/11/2024 12:00:01 PM  Radiology No results found.  Procedures Procedures    Medications Ordered in ED Medications - No data to display  ED Course/ Medical Decision Making/ A&P                                 Medical Decision Making Amount and/or Complexity of Data Reviewed Labs: ordered. Radiology: ordered. ECG/medicine tests: ordered.   Patient's EKG shows normal sinus rhythm.  Patient has no murmurs on his cardiovascular sound.  Chest x-ray shows no enlarged heart.  Feel that patient needs to have an echocardiogram and his Coumadin as an outpatient.  Have sent urgent referral to cardiology.  Patient agreeable to this        Final Clinical Impression(s) / ED Diagnoses Final diagnoses:  None    Rx / DC Orders ED Discharge Orders     None         Lind Repine, MD 02/11/24 1424

## 2024-02-14 ENCOUNTER — Encounter: Payer: Self-pay | Admitting: Cardiology

## 2024-02-14 ENCOUNTER — Ambulatory Visit

## 2024-02-14 ENCOUNTER — Ambulatory Visit: Attending: Cardiology | Admitting: Cardiology

## 2024-02-14 VITALS — BP 125/77 | HR 77 | Ht 69.0 in | Wt 188.5 lb

## 2024-02-14 DIAGNOSIS — R55 Syncope and collapse: Secondary | ICD-10-CM | POA: Diagnosis not present

## 2024-02-14 DIAGNOSIS — K219 Gastro-esophageal reflux disease without esophagitis: Secondary | ICD-10-CM | POA: Diagnosis not present

## 2024-02-14 NOTE — Patient Instructions (Signed)
 Medication Instructions:  Your physician recommends that you continue on your current medications as directed. Please refer to the Current Medication list given to you today.   *If you need a refill on your cardiac medications before your next appointment, please call your pharmacy*  Lab Work: No labs ordered today  If you have labs (blood work) drawn today and your tests are completely normal, you will receive your results only by: MyChart Message (if you have MyChart) OR A paper copy in the mail If you have any lab test that is abnormal or we need to change your treatment, we will call you to review the results.  Testing/Procedures: Your physician has requested that you have an echocardiogram. Echocardiography is a painless test that uses sound waves to create images of your heart. It provides your doctor with information about the size and shape of your heart and how well your heart's chambers and valves are working.   You may receive an ultrasound enhancing agent through an IV if needed to better visualize your heart during the echo. This procedure takes approximately one hour.  There are no restrictions for this procedure.  This will take place at [address] [facility] [address], [place] [address]  Please note: We ask at that you not bring children with you during ultrasound (echo/ vascular) testing. Due to room size and safety concerns, children are not allowed in the ultrasound rooms during exams. Our front office staff cannot provide observation of children in our lobby area while testing is being conducted. An adult accompanying a patient to their appointment will only be allowed in the ultrasound room at the discretion of the ultrasound technician under special circumstances. We apologize for any inconvenience.   Your physician has recommended that you wear a Zio AT Live monitor.   This monitor is a medical device that records the heart's electrical activity. Doctors  most often use these monitors to diagnose arrhythmias. Arrhythmias are problems with the speed or rhythm of the heartbeat. The monitor is a small device applied to your chest. You can wear one while you do your normal daily activities. While wearing this monitor if you have any symptoms to push the button and record what you felt. Once you have worn this monitor for the period of time provider prescribed (Usually 14 days), you will return the monitor device in the postage paid box/bag. Once it is returned they will download the data collected and provide us  with a report which the provider will then review and we will call you with those results.   Important tips:  Avoid showering during the first 24 hours of wearing the monitor. Avoid excessive sweating to help maximize wear time. Do not submerge the device, no hot tubs, and no swimming pools. Keep any lotions or oils away from the patch. After 24 hours you may shower with the patch on. Take brief showers with your back facing the shower head.  Do not remove patch once it has been placed because that will interrupt data and decrease adhesive wear time. Push the button when you have any symptoms and write down what you were feeling. Once you have completed wearing your monitor, remove and place into box which has postage paid and place in your outgoing mailbox.  If for some reason you have misplaced your box then call our office and we can provide another box and/or mail it off for you. Keep the transmitter within 10 feet at all times.  Expect a welcome phone call within 24-48 hrs of application from Zio.  This call will include your copay information, so please answer any unknown phone calls while wearing Zio (it could also be important information about your heart) The envelope to return Zio is in the back of the transmitter. Removal instructions are on the last page of the symptom diary.  Place the patch sticky side up inside the transmitter and  the symptom diary inside the envelope to return on your last wear day inside your mailbox or any USPS mailbox.   Follow-Up: At [facility], you and your health needs are our priority.  As part of our continuing mission to provide you with exceptional heart care, our providers are all part of one team.  This team includes your primary Cardiologist (physician) and Advanced Practice Providers or APPs (Physician Assistants and Nurse Practitioners) who all work together to provide you with the care you need, when you need it.  Your next appointment:   2 month(s)  Provider:   You may see Dr. Junnie Olives or one of the following Advanced Practice Providers on your designated Care Team:   Laneta Pintos, NP Gildardo Labrador, PA-C Varney Gentleman, PA-C Cadence [place], PA-C Ronald Cockayne, NP Morey Ar, NP    We recommend signing up for the patient portal called "MyChart".  Sign up information is provided on this After Visit Summary.  MyChart is used to connect with patients for Virtual Visits (Telemedicine).  Patients are able to view lab/test results, encounter notes, upcoming appointments, etc.  Non-urgent messages can be sent to your provider as well.   To learn more about what you can do with MyChart, go to [email or web address].

## 2024-02-14 NOTE — Progress Notes (Signed)
 Cardiology Office Note:    Date:  02/14/2024   ID:  Randall Delgado, DOB 08-29-1996, MRN [id number]  PCP:  Daved Eriksson, MD   [facility] Providers Cardiologist:  None     Referring MD: Lind Repine, MD   Chief Complaint  Patient presents with   New Patient (Initial Visit)    Near Syncope C/o heart pounding/sob. Wife would like to speak with provider or nurse concerning husband due to having to sit in the lobby with children. Meds reviewed verbally with pt.    History of Present Illness:    Randall Delgado is a 28 y.o. male with a hx of GERD, who presents due to near syncope.  Patient works for a [employer], was involved in moving a very heavy object about 10 days ago.  States object with over 300 pounds, needing for other employees.  He states feeling very fatigued, dizzy after carrying the object down the stairs.  He later on could not move his limbs, laid on the ground.  He did not lose consciousness.  He was pulled on his back, given some electrolytes water to help with hydration.  Felt better, wife drove patient to ED where workup was unrevealing.  4 days later, while at work, he tried lifting some objects and had similar symptoms.  Presented to the ED again with no significant abnormalities on EKG .  He denies having any prior symptoms.  Denies any personal or family history of heart disease.   History reviewed. No pertinent past medical history.  History reviewed. No pertinent surgical history.  Current Medications: Current Meds  Medication Sig   cetirizine (ZYRTEC) 10 MG tablet Take 10 mg by mouth daily.   fluticasone  (FLONASE ) 50 MCG/ACT nasal spray Place 2 sprays into both nostrils daily.   montelukast (SINGULAIR) 10 MG tablet Take 10 mg by mouth at bedtime.     Allergies:   Patient has no known allergies.   Social History   Socioeconomic History   Marital status: Single    Spouse name: Not on file   Number of children: Not on file   Years  of education: Not on file   Highest education level: Not on file  Occupational History   Not on file  Tobacco Use   Smoking status: Never   Smokeless tobacco: Never  Substance and Sexual Activity   Alcohol use: No   Drug use: No   Sexual activity: Not on file  Other Topics Concern   Not on file  Social History Narrative   Not on file   Social Drivers of Health   Financial Resource Strain: Not on file  Food Insecurity: Low Risk  (02/13/2024)   Received from [facility]   Hunger Vital Sign    Worried About Running Out of Food in the Last Year: Never true    Ran Out of Food in the Last Year: Never true  Transportation Needs: No Transportation Needs (02/13/2024)   Received from [facility]    In the past 12 months, has lack of reliable transportation kept you from medical appointments, meetings, work or from getting things needed for daily living? : No  Physical Activity: Not on file  Stress: Not on file  Social Connections: Not on file     Family History: The patient's family history is not on file.  ROS:   Please see the history of present illness.     All other systems reviewed and  are negative.  EKGs/Labs/Other Studies Reviewed:    The following studies were reviewed today:  EKG Interpretation Date/Time:  Thursday February 14 2024 10:54:14 EDT Ventricular Rate:  77 PR Interval:  152 QRS Duration:  90 QT Interval:  364 QTC Calculation: 411 R Axis:   35  Text Interpretation: Normal sinus rhythm Normal ECG Confirmed by Constancia Delton ([id number]) on 02/14/2024 10:59:20 AM    Recent Labs: 02/11/2024: ALT 50; BUN 13; Creatinine, Ser 1.16; Hemoglobin 15.0; Platelets 227; Potassium 3.9; Sodium 138  Recent Lipid Panel No results found for: "CHOL", "TRIG", "HDL", "CHOLHDL", "VLDL", "LDLCALC", "LDLDIRECT"   Risk Assessment/Calculations:             Physical Exam:    VS:  BP 125/77 (BP Location: Right Arm, Cuff Size: Normal)   Pulse 77   Ht 5\' 9"   (1.753 m)   Wt 188 lb 8 oz (85.5 kg)   SpO2 99%   BMI 27.84 kg/m     Wt Readings from Last 3 Encounters:  02/14/24 188 lb 8 oz (85.5 kg)  02/11/24 185 lb (83.9 kg)  02/19/17 119 lb (54 kg)     GEN:  Well nourished, well developed in no acute distress HEENT: Normal NECK: No JVD; No carotid bruits CARDIAC: RRR, no murmurs, rubs, gallops RESPIRATORY:  Clear to auscultation without rales, wheezing or rhonchi  ABDOMEN: Soft, non-tender, non-distended MUSCULOSKELETAL:  No edema; No deformity  SKIN: Warm and dry NEUROLOGIC:  Alert and oriented x 3 PSYCHIATRIC:  Normal affect   ASSESSMENT:    1. Syncope, unspecified syncope type   2. Gastroesophageal reflux disease without esophagitis    PLAN:    In order of problems listed above:  Syncope, near syncope, symptoms likely from dehydration, overexertion.  Antivert recently started by PCP.  Hopefully patient does not have any new symptoms.  Will evaluate for cardiac etiology with echo and cardiac monitor.  Reassure patient if cardiac testing is unremarkable.  Adequate hydration advised.  Orthostatic vitals today with no evidence of orthostasis. History of GERD, continue Prilosec 20 mg daily.  Follow-up after cardiac testing     Medication Adjustments/Labs and Tests Ordered: Current medicines are reviewed at length with the patient today.  Concerns regarding medicines are outlined above.  Orders Placed This Encounter  Procedures   LONG TERM MONITOR (3-14 DAYS)   EKG 12-Lead   ECHOCARDIOGRAM COMPLETE   No orders of the defined types were placed in this encounter.   Patient Instructions  Medication Instructions:  Your physician recommends that you continue on your current medications as directed. Please refer to the Current Medication list given to you today.   *If you need a refill on your cardiac medications before your next appointment, please call your pharmacy*  Lab Work: No labs ordered today  If you have labs (blood  work) drawn today and your tests are completely normal, you will receive your results only by: MyChart Message (if you have MyChart) OR A paper copy in the mail If you have any lab test that is abnormal or we need to change your treatment, we will call you to review the results.  Testing/Procedures: Your physician has requested that you have an echocardiogram. Echocardiography is a painless test that uses sound waves to create images of your heart. It provides your doctor with information about the size and shape of your heart and how well your heart's chambers and valves are working.   You may receive an ultrasound enhancing agent through an  IV if needed to better visualize your heart during the echo. This procedure takes approximately one hour.  There are no restrictions for this procedure.  This will take place at [address] [facility] [address], [place] [address]  Please note: We ask at that you not bring children with you during ultrasound (echo/ vascular) testing. Due to room size and safety concerns, children are not allowed in the ultrasound rooms during exams. Our front office staff cannot provide observation of children in our lobby area while testing is being conducted. An adult accompanying a patient to their appointment will only be allowed in the ultrasound room at the discretion of the ultrasound technician under special circumstances. We apologize for any inconvenience.   Your physician has recommended that you wear a Zio AT Live monitor.   This monitor is a medical device that records the heart's electrical activity. Doctors most often use these monitors to diagnose arrhythmias. Arrhythmias are problems with the speed or rhythm of the heartbeat. The monitor is a small device applied to your chest. You can wear one while you do your normal daily activities. While wearing this monitor if you have any symptoms to push the button and record what you felt. Once you have  worn this monitor for the period of time provider prescribed (Usually 14 days), you will return the monitor device in the postage paid box/bag. Once it is returned they will download the data collected and provide us  with a report which the provider will then review and we will call you with those results.   Important tips:  Avoid showering during the first 24 hours of wearing the monitor. Avoid excessive sweating to help maximize wear time. Do not submerge the device, no hot tubs, and no swimming pools. Keep any lotions or oils away from the patch. After 24 hours you may shower with the patch on. Take brief showers with your back facing the shower head.  Do not remove patch once it has been placed because that will interrupt data and decrease adhesive wear time. Push the button when you have any symptoms and write down what you were feeling. Once you have completed wearing your monitor, remove and place into box which has postage paid and place in your outgoing mailbox.  If for some reason you have misplaced your box then call our office and we can provide another box and/or mail it off for you. Keep the transmitter within 10 feet at all times.  Expect a welcome phone call within 24-48 hrs of application from Zio.  This call will include your copay information, so please answer any unknown phone calls while wearing Zio (it could also be important information about your heart) The envelope to return Zio is in the back of the transmitter. Removal instructions are on the last page of the symptom diary.  Place the patch sticky side up inside the transmitter and the symptom diary inside the envelope to return on your last wear day inside your mailbox or any USPS mailbox.   Follow-Up: At [facility], you and your health needs are our priority.  As part of our continuing mission to provide you with exceptional heart care, our providers are all part of one team.  This team includes your  primary Cardiologist (physician) and Advanced Practice Providers or APPs (Physician Assistants and Nurse Practitioners) who all work together to provide you with the care you need, when you need it.  Your next appointment:  2 month(s)  Provider:   You may see Dr. Junnie Olives or one of the following Advanced Practice Providers on your designated Care Team:   Laneta Pintos, NP Gildardo Labrador, PA-C Varney Gentleman, PA-C Cadence Artas, PA-C Ronald Cockayne, NP Morey Ar, NP    We recommend signing up for the patient portal called "MyChart".  Sign up information is provided on this After Visit Summary.  MyChart is used to connect with patients for Virtual Visits (Telemedicine).  Patients are able to view lab/test results, encounter notes, upcoming appointments, etc.  Non-urgent messages can be sent to your provider as well.   To learn more about what you can do with MyChart, go to [email or web address].      Signed, Constancia Delton, MD  02/14/2024 12:02 PM    [facility]

## 2024-02-15 ENCOUNTER — Encounter: Payer: Self-pay | Admitting: Cardiology

## 2024-02-19 ENCOUNTER — Ambulatory Visit: Attending: Cardiology

## 2024-02-19 DIAGNOSIS — R55 Syncope and collapse: Secondary | ICD-10-CM | POA: Diagnosis not present

## 2024-02-20 LAB — ECHOCARDIOGRAM COMPLETE
AR max vel: 2.86 cm2
AV Area VTI: 2.82 cm2
AV Area mean vel: 2.65 cm2
AV Mean grad: 4 mmHg
AV Peak grad: 7.2 mmHg
Ao pk vel: 1.34 m/s
Area-P 1/2: 3.91 cm2
Calc EF: 56.5 %
S' Lateral: 3.5 cm
Single Plane A2C EF: 58.8 %
Single Plane A4C EF: 55.8 %

## 2024-02-22 ENCOUNTER — Ambulatory Visit: Payer: Self-pay | Admitting: Cardiology

## 2024-03-14 DIAGNOSIS — R55 Syncope and collapse: Secondary | ICD-10-CM

## 2024-04-16 ENCOUNTER — Ambulatory Visit: Admitting: Family Medicine

## 2024-04-16 ENCOUNTER — Encounter: Payer: Self-pay | Admitting: Family Medicine

## 2024-04-16 ENCOUNTER — Ambulatory Visit: Payer: Self-pay | Admitting: Family Medicine

## 2024-04-16 VITALS — BP 120/76 | HR 78 | Temp 97.6°F | Ht 68.25 in | Wt 209.4 lb

## 2024-04-16 DIAGNOSIS — R7401 Elevation of levels of liver transaminase levels: Secondary | ICD-10-CM

## 2024-04-16 DIAGNOSIS — R7303 Prediabetes: Secondary | ICD-10-CM | POA: Insufficient documentation

## 2024-04-16 DIAGNOSIS — J301 Allergic rhinitis due to pollen: Secondary | ICD-10-CM | POA: Diagnosis not present

## 2024-04-16 DIAGNOSIS — Z131 Encounter for screening for diabetes mellitus: Secondary | ICD-10-CM | POA: Diagnosis not present

## 2024-04-16 DIAGNOSIS — Z13220 Encounter for screening for lipoid disorders: Secondary | ICD-10-CM | POA: Diagnosis not present

## 2024-04-16 LAB — HEMOGLOBIN A1C: Hgb A1c MFr Bld: 6.2 % (ref 4.6–6.5)

## 2024-04-16 LAB — COMPREHENSIVE METABOLIC PANEL WITH GFR
ALT: 42 U/L (ref 0–53)
AST: 30 U/L (ref 0–37)
Albumin: 4.3 g/dL (ref 3.5–5.2)
Alkaline Phosphatase: 85 U/L (ref 39–117)
BUN: 14 mg/dL (ref 6–23)
CO2: 28 meq/L (ref 19–32)
Calcium: 9.1 mg/dL (ref 8.4–10.5)
Chloride: 104 meq/L (ref 96–112)
Creatinine, Ser: 1.19 mg/dL (ref 0.40–1.50)
GFR: 81.95 mL/min (ref >60.00)
Glucose, Bld: 104 mg/dL — ABNORMAL HIGH (ref 70–99)
Potassium: 3.8 meq/L (ref 3.5–5.1)
Sodium: 138 meq/L (ref 135–145)
Total Bilirubin: 0.4 mg/dL (ref 0.2–1.2)
Total Protein: 6.8 g/dL (ref 6.0–8.3)

## 2024-04-16 LAB — LIPID PANEL
Cholesterol: 182 mg/dL (ref 0–200)
HDL: 44.5 mg/dL (ref >39.00)
LDL Cholesterol: 114 mg/dL — ABNORMAL HIGH (ref 0–99)
NonHDL: 137.37
Total CHOL/HDL Ratio: 4
Triglycerides: 115 mg/dL (ref 0.0–149.0)
VLDL: 23 mg/dL (ref 0.0–40.0)

## 2024-04-16 MED ORDER — FLUTICASONE PROPIONATE 50 MCG/ACT NA SUSP: 2.0000 | Freq: Every day | NASAL | 12 refills | Status: AC

## 2024-04-16 NOTE — Assessment & Plan Note (Signed)
 Stable. Continue cetirizine 10 mg daily, fluticasone  nasal spray, and montelukast 10 mg daily.

## 2024-04-16 NOTE — Progress Notes (Signed)
 [facility] [address] [place] [address] Dept: [phone] Dept Fax: [phone]  New Patient Office Visit  Subjective:    Patient ID: Randall Delgado, male    DOB: 02-Apr-1996, 28 y.o..   MRN: [id number]  Chief Complaint  Patient presents with   Establish Care    NP-establish care.     History of Present Illness:  Patient is in today to establish care. Randall Delgado was born in [place]. His father liked ot move the family around. He finished high school in [place], [place]. He ash moved around a bit himself, including [place], [place], and [place]. He attended one semester at [employer] studying sports science and fitness. Randall Delgado has been married for 7 years. He has four children (6, 5, 3, 1). He denies use of alcohol, tobacco, or drugs.  Randall Delgado has a history of perennial allergic rhinitis. He is managed on cetirizine 10 mg daily, fluticasone  nasal spray, and montelukast 10 mg daily. He finds this combination generally controls his symptoms. he had a history of eczema in childhood. He occasionally breaks out with a rash when his allergies flare.  Past Medical History: Patient Active Problem List   Diagnosis Date Noted   Allergic rhinitis 07/26/2023   History reviewed. No pertinent surgical history. Family History  Problem Relation Age of Onset   Cancer Maternal Grandfather    Diabetes Maternal Grandfather    Arthritis Maternal Grandmother    Alcohol abuse Paternal Grandfather    Diabetes Paternal Grandmother    Outpatient Medications Prior to Visit  Medication Sig Dispense Refill   cetirizine (ZYRTEC) 10 MG tablet Take 10 mg by mouth daily.     montelukast (SINGULAIR) 10 MG tablet Take 10 mg by mouth at bedtime.     fluticasone  (FLONASE ) 50 MCG/ACT nasal spray Place 2 sprays into both nostrils daily. 16 g 12   No facility-administered medications prior to visit.   No Known Allergies Objective:   Today's  Vitals   04/16/24 1359  BP: 120/76  Pulse: 78  Temp: 97.6 F (36.4 C)  TempSrc: Temporal  SpO2: 98%  Weight: 209 lb 6.4 oz (95 kg)  Height: 5' 8.25 (1.734 m)   Body mass index is 31.61 kg/m.   General: Well developed, well nourished. No acute distress. Psych: Alert and oriented. Normal mood and affect.  Health Maintenance Due  Topic Date Due   HIV Screening  Never done   Hepatitis C Screening  Never done   HPV VACCINES (3 - Male 3-dose series) 08/02/2017   INFLUENZA VACCINE  04/11/2024     Lab Results:    Latest Ref Rng & Units 04/16/2024    2:43 PM 02/11/2024   11:43 AM 02/07/2024    7:13 PM  CMP  Glucose 70 - 99 mg/dL 895  90  66   BUN 6 - 23 mg/dL 14  13  17    Creatinine 0.40 - 1.50 mg/dL 8.80  8.83  8.79   Sodium 135 - 145 mEq/L 138  138  140   Potassium 3.5 - 5.1 mEq/L 3.8  3.9  3.9   Chloride 96 - 112 mEq/L 104  102  104   CO2 19 - 32 mEq/L 28  25  23    Calcium 8.4 - 10.5 mg/dL 9.1  89.9  89.9   Total Protein 6.0 - 8.3 g/dL 6.8  7.4  7.2   Total Bilirubin 0.2 - 1.2 mg/dL 0.4  <9.7  0.3   Alkaline Phos 39 - 117 U/L 85  105  98   AST 0 - 37 U/L 30  35  39   ALT 0 - 53 U/L 42  50  40     Assessment & Plan:   Problem List Items Addressed This Visit       Respiratory   Allergic rhinitis - Primary   Stable. Continue cetirizine 10 mg daily, fluticasone  nasal spray, and montelukast 10 mg daily.      Relevant Medications   fluticasone  (FLONASE ) 50 MCG/ACT nasal spray   Other Visit Diagnoses       Elevated ALT measurement       Relevant Orders   Comprehensive metabolic panel with GFR (Completed)     Screening for lipid disorders       Relevant Orders   Lipid panel (Completed)     Screening for diabetes mellitus (DM)       Relevant Orders   Hemoglobin A1c (Completed)   Comprehensive metabolic panel with GFR (Completed)       Return for Annual preventative care.   Randall CHRISTELLA Simpler, MD

## 2024-04-17 ENCOUNTER — Encounter: Payer: Self-pay | Admitting: Cardiology

## 2024-04-17 ENCOUNTER — Ambulatory Visit: Attending: Cardiology | Admitting: Cardiology

## 2024-04-17 VITALS — BP 110/60 | HR 88 | Ht 69.0 in | Wt 209.6 lb

## 2024-04-17 DIAGNOSIS — K219 Gastro-esophageal reflux disease without esophagitis: Secondary | ICD-10-CM | POA: Diagnosis present

## 2024-04-17 DIAGNOSIS — R55 Syncope and collapse: Secondary | ICD-10-CM | POA: Diagnosis present

## 2024-04-17 NOTE — Progress Notes (Signed)
 Cardiology Office Note:    Date:  04/17/2024   ID:  Randall A Berrong, DOB 08/20/1996, MRN [id number]  PCP:  Thedora Garnette HERO, MD   [facility] Providers Cardiologist:  None     Referring MD: Jhon Elveria LABOR, MD   No chief complaint on file.   History of Present Illness:    Randall Delgado is a 28 y.o. male with a hx of GERD, who presents for follow-up.  He was last seen due to near syncope.  Symptoms occurred in the context of overexertion, possible dehydration.  Has not had any episodes since.  Echo and cardiac monitor obtained to evaluate any cardiac etiologies.  Patient presents for cardiac testing results.   Past Medical History:  Diagnosis Date   Allergy 2015   Started with a sinus infection   GERD (gastroesophageal reflux disease) 2024   Syncope and collapse 5/29   Happened aftwr moving a heavt item    No past surgical history on file.  Current Medications: Current Meds  Medication Sig   cetirizine (ZYRTEC) 10 MG tablet Take 10 mg by mouth daily.   fluticasone  (FLONASE ) 50 MCG/ACT nasal spray Place 2 sprays into both nostrils daily.   montelukast (SINGULAIR) 10 MG tablet Take 10 mg by mouth at bedtime.     Allergies:   Patient has no known allergies.   Social History   Socioeconomic History   Marital status: Single    Spouse name: Not on file   Number of children: 4   Years of education: Not on file   Highest education level: Some college, no degree  Occupational History   Occupation: Driver/Furniture Moving    Comment: [employer]  Tobacco Use   Smoking status: Never   Smokeless tobacco: Never  Vaping Use   Vaping status: Never Used  Substance and Sexual Activity   Alcohol use: No   Drug use: No   Sexual activity: Yes    Birth control/protection: None  Other Topics Concern   Not on file  Social History Narrative   Not on file   Social Drivers of Health   Financial Resource Strain: High Risk (04/15/2024)   Overall  Financial Resource Strain (CARDIA)    Difficulty of Paying Living Expenses: Hard  Food Insecurity: Food Insecurity Present (04/15/2024)   Hunger Vital Sign    Worried About Running Out of Food in the Last Year: Often true    Ran Out of Food in the Last Year: Sometimes true  Transportation Needs: Unmet Transportation Needs (04/15/2024)   PRAPARE - Administrator, Civil Service (Medical): Yes    Lack of Transportation (Non-Medical): No  Physical Activity: Sufficiently Active (04/15/2024)   Exercise Vital Sign    Days of Exercise per Week: 6 days    Minutes of Exercise per Session: 150+ min  Stress: No Stress Concern Present (04/15/2024)   [employer] of Occupational Health - Occupational Stress Questionnaire    Feeling of Stress: Not at all  Social Connections: Moderately Integrated (04/15/2024)   Social Connection and Isolation Panel    Frequency of Communication with Friends and Family: More than three times a week    Frequency of Social Gatherings with Friends and Family: Once a week    Attends Religious Services: 1 to 4 times per year    Active Member of [employer] or Organizations: No    Attends Engineer, structural: Not on file    Marital Status: Married  Family History: The patient's family history includes Alcohol abuse in his paternal grandfather; Arthritis in his maternal grandmother; Cancer in his maternal grandfather; Diabetes in his maternal grandfather and paternal grandmother; Hypertension in his father; Thyroid disease in his mother.  ROS:   Please see the history of present illness.     All other systems reviewed and are negative.  EKGs/Labs/Other Studies Reviewed:    The following studies were reviewed today:       Recent Labs: 02/11/2024: Hemoglobin 15.0; Platelets 227 04/16/2024: ALT 42; BUN 14; Creatinine, Ser 1.19; Potassium 3.8; Sodium 138  Recent Lipid Panel    Component Value Date/Time   CHOL 182 04/16/2024 1443   TRIG 115.0 04/16/2024  1443   HDL 44.50 04/16/2024 1443   CHOLHDL 4 04/16/2024 1443   VLDL 23.0 04/16/2024 1443   LDLCALC 114 (H) 04/16/2024 1443     Risk Assessment/Calculations:             Physical Exam:    VS:  BP 110/60 (BP Location: Left Arm, Patient Position: Sitting, Cuff Size: Normal)   Pulse 88   Ht 5' 9 (1.753 m)   Wt 209 lb 9.6 oz (95.1 kg)   SpO2 99%   BMI 30.95 kg/m     Wt Readings from Last 3 Encounters:  04/17/24 209 lb 9.6 oz (95.1 kg)  04/16/24 209 lb 6.4 oz (95 kg)  02/14/24 188 lb 8 oz (85.5 kg)     GEN:  Well nourished, well developed in no acute distress HEENT: Normal NECK: No JVD; No carotid bruits CARDIAC: RRR, no murmurs, rubs, gallops RESPIRATORY:  Clear to auscultation without rales, wheezing or rhonchi  ABDOMEN: Soft, non-tender, non-distended MUSCULOSKELETAL:  No edema; No deformity  SKIN: Warm and dry NEUROLOGIC:  Alert and oriented x 3 PSYCHIATRIC:  Normal affect   ASSESSMENT:    1. Syncope, unspecified syncope type   2. Gastroesophageal reflux disease without esophagitis     PLAN:    In order of problems listed above:  Syncope, near syncope, symptoms likely from dehydration, overexertion.  Antivert recently started by PCP.  Cardiac monitor 07/25 showed no sustained arrhythmias, 1 run of nonsustained SVT lasting 16 seconds.  Echo 6/25 normal systolic and diastolic function, EF 55 to 60%.  Adequate hydration advised.  Prior orthostatic vitals showed no evidence of orthostasis. History of GERD, continue Prilosec 20 mg daily.  Follow-up as needed.     Medication Adjustments/Labs and Tests Ordered: Current medicines are reviewed at length with the patient today.  Concerns regarding medicines are outlined above.  No orders of the defined types were placed in this encounter.  No orders of the defined types were placed in this encounter.   There are no Patient Instructions on file for this visit.   Signed, Redell Cave, MD  04/17/2024 12:29 PM     [facility]

## 2024-04-17 NOTE — Patient Instructions (Signed)
 Medication Instructions:  Your physician recommends that you continue on your current medications as directed. Please refer to the Current Medication list given to you today.   *If you need a refill on your cardiac medications before your next appointment, please call your pharmacy*  Lab Work: No labs ordered today  If you have labs (blood work) drawn today and your tests are completely normal, you will receive your results only by: MyChart Message (if you have MyChart) OR A paper copy in the mail If you have any lab test that is abnormal or we need to change your treatment, we will call you to review the results.  Testing/Procedures: No test ordered today   Follow-Up: At [facility], you and your health needs are our priority.  As part of our continuing mission to provide you with exceptional heart care, our providers are all part of one team.  This team includes your primary Cardiologist (physician) and Advanced Practice Providers or APPs (Physician Assistants and Nurse Practitioners) who all work together to provide you with the care you need, when you need it.  Your next appointment:   As needed

## 2024-05-14 ENCOUNTER — Ambulatory Visit (INDEPENDENT_AMBULATORY_CARE_PROVIDER_SITE_OTHER): Admitting: Family Medicine

## 2024-05-14 ENCOUNTER — Encounter: Payer: Self-pay | Admitting: Family Medicine

## 2024-05-14 VITALS — BP 124/82 | HR 74 | Temp 97.2°F | Ht 69.0 in | Wt 206.2 lb

## 2024-05-14 DIAGNOSIS — Z0000 Encounter for general adult medical examination without abnormal findings: Secondary | ICD-10-CM | POA: Diagnosis not present

## 2024-05-14 DIAGNOSIS — R7303 Prediabetes: Secondary | ICD-10-CM | POA: Diagnosis not present

## 2024-05-14 DIAGNOSIS — J301 Allergic rhinitis due to pollen: Secondary | ICD-10-CM

## 2024-05-14 NOTE — Progress Notes (Signed)
 [facility] PRIMARY CARE [facility] [address] [place] [address] Dept: [phone] Dept Fax: [phone]  Annual Physical Visit  Subjective:    Patient ID: Randall Delgado, male    DOB: 10-08-1995, 28 y.o..   MRN: [id number]  Chief Complaint  Patient presents with   Annual Exam    CPE. Pt is fasting.    History of Present Illness:  Patient is in today for an annual physical/preventative visit.  Review of Systems  Constitutional:  Negative for chills, diaphoresis, fever, malaise/fatigue and weight loss.  HENT:  Positive for congestion and nosebleeds. Negative for ear pain, hearing loss, sinus pain, sore throat and tinnitus.        Has a history of allergic rhinitis. Manages this with cetirizine, fluticasone  nasal spray,a nd montelukast. He has had some recent nosebleeds.  Eyes:  Positive for blurred vision. Negative for pain, discharge and redness.       Notes he gets some visual blurring when his sinus symptoms flare. Otherwise, his vision is normal.  Respiratory:  Negative for cough, shortness of breath and wheezing.   Cardiovascular:  Negative for chest pain and palpitations.  Gastrointestinal:  Positive for heartburn. Negative for abdominal pain, constipation, diarrhea, nausea and vomiting.       Heartburn about once a week. he manages this with antacids.  Genitourinary: Negative.   Musculoskeletal:  Negative for back pain, joint pain and myalgias.  Skin:  Negative for itching and rash.  Psychiatric/Behavioral:  Negative for depression. The patient is not nervous/anxious.    Past Medical History: Patient Active Problem List   Diagnosis Date Noted   Prediabetes 04/16/2024   Allergic rhinitis 07/26/2023   History reviewed. No pertinent surgical history. Family History  Problem Relation Age of Onset   Thyroid disease Mother    Hypertension Father    Arthritis Maternal Grandmother    Cancer Maternal Grandfather        Colon    Diabetes Maternal Grandfather    Diabetes Paternal Grandmother    Alcohol abuse Paternal Grandfather    Outpatient Medications Prior to Visit  Medication Sig Dispense Refill   cetirizine (ZYRTEC) 10 MG tablet Take 10 mg by mouth daily.     fluticasone  (FLONASE ) 50 MCG/ACT nasal spray Place 2 sprays into both nostrils daily. 11.1 mL 12   montelukast (SINGULAIR) 10 MG tablet Take 10 mg by mouth at bedtime.     No facility-administered medications prior to visit.   No Known Allergies Objective:   Today's Vitals   05/14/24 0937  BP: 124/82  Pulse: 74  Temp: (!) 97.2 F (36.2 C)  TempSrc: Temporal  SpO2: 97%  Weight: 206 lb 3.2 oz (93.5 kg)  Height: 5' 9 (1.753 m)   Body mass index is 30.45 kg/m.   General: Well developed, well nourished. No acute distress. HEENT: Normocephalic, non-traumatic. PERRL, EOMI. Conjunctiva clear. External ears normal. EAC   impacted with soft wax bilaterally. Nose with a small area of superficial irritation and scabbing inside   the left nostril. No congestion or rhinorrhea. Mucous membranes  moist. Oropharynx clear. Good dentition. Neck: Supple. No lymphadenopathy. No thyromegaly. Lungs: Clear to auscultation bilaterally. No wheezing, rales or rhonchi. CV: RRR without murmurs or rubs. Pulses 2+ bilaterally. Abdomen: Soft, non-tender. Bowel sounds positive, normal pitch and frequency. No   hepatosplenomegaly. No rebound or guarding. Extremities: Full ROM. No joint swelling or tenderness. No edema noted. Skin: Warm and dry. No rashes. Psych: Alert and oriented. Normal  mood and affect.  Health Maintenance Due  Topic Date Due   HIV Screening  Never done   Hepatitis C Screening  Never done   HPV VACCINES (3 - Male 3-dose series) 08/02/2017   COVID-19 Vaccine (1 - 2024-25 season) Never done     Lab Results: Lab Results  Component Value Date   HGBA1C 6.2 04/16/2024   Lab Results  Component Value Date   CHOL 182 04/16/2024   HDL 44.50  04/16/2024   LDLCALC 114 (H) 04/16/2024   TRIG 115.0 04/16/2024   CHOLHDL 4 04/16/2024      Latest Ref Rng & Units 04/16/2024    2:43 PM 02/11/2024   11:43 AM 02/07/2024    7:13 PM  CMP  Glucose 70 - 99 mg/dL 895  90  66   BUN 6 - 23 mg/dL 14  13  17    Creatinine 0.40 - 1.50 mg/dL 8.80  8.83  8.79   Sodium 135 - 145 mEq/L 138  138  140   Potassium 3.5 - 5.1 mEq/L 3.8  3.9  3.9   Chloride 96 - 112 mEq/L 104  102  104   CO2 19 - 32 mEq/L 28  25  23    Calcium 8.4 - 10.5 mg/dL 9.1  89.9  89.9   Total Protein 6.0 - 8.3 g/dL 6.8  7.4  7.2   Total Bilirubin 0.2 - 1.2 mg/dL 0.4  <9.7  0.3   Alkaline Phos 39 - 117 U/L 85  105  98   AST 0 - 37 U/L 30  35  39   ALT 0 - 53 U/L 42  50  40    Assessment & Plan:   Problem List Items Addressed This Visit       Respiratory   Allergic rhinitis   Stable. Continue cetirizine 10 mg daily, fluticasone  nasal spray, and montelukast 10 mg daily. Discussed management of nosebleeds. Recommend dabbing the left nasal antrium with Vaseline to promote healing.        Other   Annual physical exam - Primary   Overall health is excellent. Recommend ongoing regular exercise. Discussed recommended screenings and immunizations.       Prediabetes   Reviewed A1c results. Recommend regular exercise and maintaining a normal weight to reduce risk for progression to diabetes.      Return in about 1 year (around 05/14/2025) for Annual preventative care.   Garnette CHRISTELLA Simpler, MD

## 2024-05-14 NOTE — Assessment & Plan Note (Addendum)
 Stable. Continue cetirizine 10 mg daily, fluticasone  nasal spray, and montelukast 10 mg daily. Discussed management of nosebleeds. Recommend dabbing the left nasal antrium with Vaseline to promote healing.

## 2024-05-14 NOTE — Assessment & Plan Note (Signed)
 Reviewed A1c results. Recommend regular exercise and maintaining a normal weight to reduce risk for progression to diabetes.

## 2024-05-14 NOTE — Assessment & Plan Note (Signed)
 Overall health is excellent. Recommend ongoing regular exercise. Discussed recommended screenings and immunizations.

## 2024-05-17 ENCOUNTER — Other Ambulatory Visit: Payer: Self-pay

## 2024-05-17 DIAGNOSIS — M542 Cervicalgia: Secondary | ICD-10-CM | POA: Diagnosis present

## 2024-05-17 DIAGNOSIS — S134XXA Sprain of ligaments of cervical spine, initial encounter: Secondary | ICD-10-CM | POA: Insufficient documentation

## 2024-05-17 DIAGNOSIS — R519 Headache, unspecified: Secondary | ICD-10-CM | POA: Diagnosis not present

## 2024-05-17 DIAGNOSIS — Y92410 Unspecified street and highway as the place of occurrence of the external cause: Secondary | ICD-10-CM | POA: Diagnosis not present
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
## 2024-05-18 ENCOUNTER — Emergency Department (HOSPITAL_BASED_OUTPATIENT_CLINIC_OR_DEPARTMENT_OTHER)

## 2024-05-18 ENCOUNTER — Emergency Department (HOSPITAL_BASED_OUTPATIENT_CLINIC_OR_DEPARTMENT_OTHER)
Admission: EM | Admit: 2024-05-18 | Discharge: 2024-05-18 | Disposition: A | Discharge status: Home / Self Care | Attending: Emergency Medicine | Admitting: Emergency Medicine

## 2024-05-18 DIAGNOSIS — S139XXA Sprain of joints and ligaments of unspecified parts of neck, initial encounter: Secondary | ICD-10-CM

## 2024-05-18 MED ORDER — IBUPROFEN 400 MG PO TABS
600.0000 mg | ORAL_TABLET | Freq: Once | ORAL | Status: AC
  Administered 2024-05-18: 600 mg via ORAL
  Filled 2024-05-18: qty 1

## 2024-05-18 MED ORDER — ACETAMINOPHEN 325 MG PO TABS
650.0000 mg | ORAL_TABLET | Freq: Once | ORAL | Status: AC
  Administered 2024-05-18: 650 mg via ORAL
  Filled 2024-05-18: qty 2
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
## 2024-05-18 NOTE — Discharge Instructions (Addendum)
 Apply ice for 30 minutes at a time, 4 times a day.  You may take either ibuprofen  or naproxen as needed for pain.  For additional pain relief, add acetaminophen .  Acetaminophen  works on pain in a completely different way than ibuprofen  and naproxen, when you combine acetaminophen  with either ibuprofen  or naproxen, you get better pain relief and you get from taking other medication by itself.

## 2024-05-19 ENCOUNTER — Telehealth: Payer: Self-pay

## 2024-05-19 NOTE — Transitions of Care (Post Inpatient/ED Visit) (Signed)
   05/19/2024  Name: Randall Delgado MRN: [id number] DOB: Mar 23, 1996  Today's TOC FU Call Status: Today's TOC FU Call Status:: Successful TOC FU Call Completed TOC FU Call Complete Date: 05/18/24 Patient's Name and Date of Birth confirmed.  Transition Care Management Follow-up Telephone Call Date of Discharge: 05/18/24 Type of Discharge: Emergency Department Reason for ED Visit: Other: How have you been since you were released from the hospital?: Better Any questions or concerns?: No  Items Reviewed: Medications obtained,verified, and reconciled?: Yes (Medications Reviewed) Any new allergies since your discharge?: No Dietary orders reviewed?: NA Do you have support at home?: Yes  Medications Reviewed Today: Medications Reviewed Today   Medications were not reviewed in this encounter     Home Care and Equipment/Supplies: Were Home Health Services Ordered?: NA Any new equipment or medical supplies ordered?: NA  Functional Questionnaire: Do you need assistance with bathing/showering or dressing?: No Do you need assistance with meal preparation?: No Do you need assistance with eating?: No Do you have difficulty maintaining continence: No Do you need assistance with getting out of bed/getting out of a chair/moving?: No Do you have difficulty managing or taking your medications?: No  Follow up appointments reviewed: PCP Follow-up appointment confirmed?: No MD Provider Line [phone] Given: Yes Specialist Hospital Follow-up appointment confirmed?: NA Do you need transportation to your follow-up appointment?: No Do you understand care options if your condition(s) worsen?: Yes-patient verbalized understanding    SIGNATURE Hayden Mabin D, CMA

## 2024-05-22 ENCOUNTER — Encounter: Payer: Self-pay | Admitting: Family Medicine

## 2024-05-22 NOTE — Telephone Encounter (Signed)
 Called and spoke to patient.  Advised that he would need to be seen in the office for an evaluation for the neck pain.  He will call beck to schedule if it gets worse. Dm/cma

## 2024-05-30 ENCOUNTER — Ambulatory Visit: Payer: Self-pay

## 2024-05-30 NOTE — Telephone Encounter (Signed)
 FYI Only or Action Required?: FYI only for provider.  Patient was last seen in primary care on 05/14/2024 by Thedora Garnette HERO, MD.  Called Nurse Triage reporting Sinusitis, Dizziness, and Epistaxis.  Symptoms began several days ago.  Interventions attempted: Rest, hydration, or home remedies.  Symptoms are: gradually worsening.  Triage Disposition: See HCP Within 4 Hours (Or PCP Triage)  Patient/caregiver understands and will follow disposition?: Yes   Copied from CRM [id number]. Topic: Clinical - Red Word Triage >> May 30, 2024  8:58 AM Suzen RAMAN wrote: Red Word that prompted transfer to Nurse Triage: possible sinus infection,intermittent dizziness and nose bleeds. Patient wife on the line, requesting the soonest appt with any available provider Reason for Disposition  [1] SEVERE sinus pain (e.g., excruciating) AND [2] not improved 2 hours after pain medicine  Answer Assessment - Initial Assessment Questions Additional info: Offered next available acute visit at 10:00 but patient would not be able to make that in time, will proceed to urgent care today for evaluation.   1. LOCATION: Where does it hurt?      sinus 2. ONSET: When did the sinus pain start?  (e.g., hours, days)      2-3 days 3. SEVERITY: How bad is the pain?   (Scale 0-10; or none, mild, moderate or severe)     moderate 4. RECURRENT SYMPTOM: Have you ever had sinus problems before? If Yes, ask: When was the last time? and What happened that time?      yes 5. NASAL CONGESTION: Is the nose blocked? If Yes, ask: Can you open it or must you breathe through your mouth?     yes 6. NASAL DISCHARGE: Do you have discharge from your nose? If so ask, What color?     copious 7. FEVER: Do you have a fever? If Yes, ask: What is it, how was it measured, and when did it start?       denies 8. OTHER SYMPTOMS: Do you have any other symptoms? (e.g., sore throat, cough, earache, difficulty breathing)      Cough, bloody nose intermittently, intermittent dizziness  Protocols used: Sinus Pain or Congestion-A-AH

## 2024-05-30 NOTE — Telephone Encounter (Signed)
 Noted. Patient going to urgent care per nurse triage.

## 2024-09-16 ENCOUNTER — Encounter: Payer: Self-pay | Admitting: Family Medicine

## 2024-09-16 MED ORDER — CETIRIZINE HCL 10 MG PO TABS: 10.0000 mg | ORAL_TABLET | Freq: Every day | ORAL | 3 refills | Status: AC

## 2024-09-16 NOTE — Telephone Encounter (Signed)
 Patient requesting a refill on Certrizine.  Please review and advise.  Thanks. Dm/cma
